# Patient Record
Sex: Female | Born: 1980 | Race: White | Hispanic: No | Marital: Single | Smoking: Never smoker
Health system: Southern US, Community
[De-identification: ages and names within clinical notes are randomized; demographics above are authoritative.]

---
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
## 2001-02-28 ENCOUNTER — Encounter: Admission: RE | Admit: 2001-02-28 | Discharge: 2001-05-29 | Payer: Self-pay | Admitting: *Deleted

## 2002-11-02 ENCOUNTER — Emergency Department (HOSPITAL_COMMUNITY): Admission: EM | Admit: 2002-11-02 | Discharge: 2002-11-02 | Payer: Self-pay | Admitting: Emergency Medicine

## 2005-02-07 ENCOUNTER — Emergency Department (HOSPITAL_COMMUNITY): Admission: EM | Admit: 2005-02-07 | Discharge: 2005-02-07 | Payer: Self-pay | Admitting: Emergency Medicine

## 2005-03-23 ENCOUNTER — Ambulatory Visit: Payer: Self-pay | Admitting: Gastroenterology

## 2005-04-27 ENCOUNTER — Ambulatory Visit: Payer: Self-pay | Admitting: Gastroenterology

## 2011-08-15 ENCOUNTER — Encounter (HOSPITAL_COMMUNITY): Payer: Self-pay | Admitting: Emergency Medicine

## 2011-08-15 ENCOUNTER — Emergency Department (HOSPITAL_COMMUNITY): Payer: No Typology Code available for payment source

## 2011-08-15 ENCOUNTER — Emergency Department (HOSPITAL_COMMUNITY)
Admission: EM | Admit: 2011-08-15 | Discharge: 2011-08-15 | Disposition: A | Discharge status: Home / Self Care | Payer: No Typology Code available for payment source | Attending: Emergency Medicine | Admitting: Emergency Medicine

## 2011-08-15 DIAGNOSIS — R51 Headache: Secondary | ICD-10-CM | POA: Insufficient documentation

## 2011-08-15 DIAGNOSIS — S139XXA Sprain of joints and ligaments of unspecified parts of neck, initial encounter: Secondary | ICD-10-CM | POA: Insufficient documentation

## 2011-08-15 DIAGNOSIS — M549 Dorsalgia, unspecified: Secondary | ICD-10-CM | POA: Insufficient documentation

## 2011-08-15 DIAGNOSIS — Y92410 Unspecified street and highway as the place of occurrence of the external cause: Secondary | ICD-10-CM | POA: Insufficient documentation

## 2011-08-15 DIAGNOSIS — S161XXA Strain of muscle, fascia and tendon at neck level, initial encounter: Secondary | ICD-10-CM

## 2011-08-15 MED ORDER — CYCLOBENZAPRINE HCL 10 MG PO TABS: 10.0000 mg | ORAL_TABLET | Freq: Two times a day (BID) | ORAL | Status: AC | PRN
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
## 2011-08-15 NOTE — ED Notes (Signed)
Patient transported to X-ray 
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
## 2011-08-15 NOTE — ED Notes (Signed)
Pt returned from xray to ED11.

## 2012-04-18 ENCOUNTER — Institutional Professional Consult (permissible substitution): Payer: Self-pay | Admitting: Pediatrics

## 2012-07-01 ENCOUNTER — Encounter (HOSPITAL_COMMUNITY)
Admission: RE | Admit: 2012-07-01 | Discharge: 2012-07-01 | Disposition: A | Discharge status: Home / Self Care | Payer: BC Managed Care – PPO | Source: Ambulatory Visit | Attending: Family Medicine | Admitting: Family Medicine

## 2012-07-01 DIAGNOSIS — O923 Agalactia: Secondary | ICD-10-CM | POA: Insufficient documentation
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
## 2020-05-19 ENCOUNTER — Other Ambulatory Visit: Payer: Self-pay | Admitting: Primary Care

## 2020-05-19 ENCOUNTER — Telehealth: Payer: Self-pay | Admitting: Adult Health

## 2020-05-19 ENCOUNTER — Telehealth (HOSPITAL_COMMUNITY): Payer: Self-pay

## 2020-05-19 NOTE — Telephone Encounter (Signed)
Called and LMOM regarding monoclonal antibody treatment for COVID 19 given to those who are at risk for complications and/or hospitalization of the virus. Unclear which criteria Houston Siren meets, asked that she call me back to discuss further.  Call back number given: [phone]  Lillard Anes, NP

## 2020-05-19 NOTE — Telephone Encounter (Signed)
Called to Discuss with patient about Covid symptoms and the use of the monoclonal antibody infusion for those with mild to moderate Covid symptoms and at a high risk of hospitalization.     Pt appears to potentially qualify for this infusion due to co-morbid conditions and/or a member of an at-risk group in accordance with the FDA Emergency Use Authorization.    Pre-screened patient and referred information to APP for further screening and potential scheduling for infusion.

## 2020-05-19 NOTE — Progress Notes (Signed)
I connected by phone with Houston Siren on 05/19/2020 at 2:45 PM to discuss the potential use of a new treatment for mild to moderate COVID-19 viral infection in non-hospitalized patients.  This patient is a 39 y.o. female that meets the FDA criteria for Emergency Use Authorization of COVID monoclonal antibody casirivimab/imdevimab or bamlanivimab/eteseviamb.  Has a (+) direct SARS-CoV-2 viral test result  Has mild or moderate COVID-19   Is NOT hospitalized due to COVID-19  Is within 10 days of symptom onset  Has at least one of the high risk factor(s) for progression to severe COVID-19 and/or hospitalization as defined in EUA.  Specific high risk criteria : BMI > 25   I have spoken and communicated the following to the patient or parent/caregiver regarding COVID monoclonal antibody treatment:  1. FDA has authorized the emergency use for the treatment of mild to moderate COVID-19 in adults and pediatric patients with positive results of direct SARS-CoV-2 viral testing who are 70 years of age and older weighing at least 40 kg, and who are at high risk for progressing to severe COVID-19 and/or hospitalization.  2. The significant known and potential risks and benefits of COVID monoclonal antibody, and the extent to which such potential risks and benefits are unknown.  3. Information on available alternative treatments and the risks and benefits of those alternatives, including clinical trials.  4. Patients treated with COVID monoclonal antibody should continue to self-isolate and use infection control measures (e.g., wear mask, isolate, social distance, avoid sharing personal items, clean and disinfect "high touch" surfaces, and frequent handwashing) according to CDC guidelines.   5. The patient or parent/caregiver has the option to accept or refuse COVID monoclonal antibody treatment.  After reviewing this information with the patient, the patient has agreed to receive one of the  available covid 19 monoclonal antibodies and will be provided an appropriate fact sheet prior to infusion. Doreene Nest, NP 05/19/2020 2:45 PM

## 2020-05-19 NOTE — Telephone Encounter (Signed)
Attempted to reach patient to discuss qualifications for MAB treatment, no answer. Left voicemail with hotline number to call back.

## 2020-05-20 ENCOUNTER — Ambulatory Visit (HOSPITAL_COMMUNITY)
Admission: RE | Admit: 2020-05-20 | Discharge: 2020-05-20 | Disposition: A | Discharge status: Home / Self Care | Payer: Managed Care, Other (non HMO) | Source: Ambulatory Visit | Attending: Pulmonary Disease | Admitting: Pulmonary Disease

## 2020-05-20 DIAGNOSIS — Z6825 Body mass index (BMI) 25.0-25.9, adult: Secondary | ICD-10-CM | POA: Insufficient documentation

## 2020-05-20 DIAGNOSIS — U071 COVID-19: Secondary | ICD-10-CM | POA: Insufficient documentation

## 2020-05-20 MED ORDER — EPINEPHRINE 0.3 MG/0.3ML IJ SOAJ: 0.3000 mg | Freq: Once | INTRAMUSCULAR | Status: DC | PRN

## 2020-05-20 MED ORDER — DIPHENHYDRAMINE HCL 50 MG/ML IJ SOLN: 50.0000 mg | Freq: Once | INTRAMUSCULAR | Status: DC | PRN

## 2020-05-20 MED ORDER — SODIUM CHLORIDE 0.9 % IV SOLN: Freq: Once | INTRAVENOUS | Status: AC

## 2020-05-20 MED ORDER — METHYLPREDNISOLONE SODIUM SUCC 125 MG IJ SOLR: 125.0000 mg | Freq: Once | INTRAMUSCULAR | Status: DC | PRN

## 2020-05-20 MED ORDER — SODIUM CHLORIDE 0.9 % IV SOLN: INTRAVENOUS | Status: DC | PRN

## 2020-05-20 MED ORDER — FAMOTIDINE IN NACL 20-0.9 MG/50ML-% IV SOLN: 20.0000 mg | Freq: Once | INTRAVENOUS | Status: DC | PRN

## 2020-05-20 MED ORDER — ALBUTEROL SULFATE HFA 108 (90 BASE) MCG/ACT IN AERS: 2.0000 | INHALATION_SPRAY | Freq: Once | RESPIRATORY_TRACT | Status: DC | PRN

## 2020-05-20 NOTE — Discharge Instructions (Signed)

## 2020-05-20 NOTE — Progress Notes (Signed)
  Diagnosis: COVID-19  Physician:  dr Delford Field  Procedure: [facility] Infusion [facility] Med: bamlanivimab\etesevimab infusion - Provided patient with bamlanimivab\etesevimab fact sheet for patients, parents and caregivers prior to infusion.  Complications: No immediate complications noted.  Discharge: Discharged home   Shaune Spittle 05/20/2020

## 2021-10-03 ENCOUNTER — Ambulatory Visit: Payer: Managed Care, Other (non HMO) | Attending: Physician Assistant | Admitting: Physical Therapy

## 2021-10-03 ENCOUNTER — Other Ambulatory Visit: Payer: Self-pay

## 2021-10-03 DIAGNOSIS — R279 Unspecified lack of coordination: Secondary | ICD-10-CM | POA: Insufficient documentation

## 2021-10-03 DIAGNOSIS — M6281 Muscle weakness (generalized): Secondary | ICD-10-CM | POA: Diagnosis present

## 2021-10-03 NOTE — Therapy (Signed)
?OUTPATIENT PHYSICAL THERAPY FEMALE PELVIC EVALUATION ? ? ?Patient Name: Patricia Jordan ?MRN: [id number] ?DOB:[birth year], 40 y.o., female ?Today's Date: 10/06/2021 ? ? PT End of Session - 10/06/21 1012   ? ? Visit Number 1   ? Date for PT Re-Evaluation 12/26/21   ? PT Start Time 1539   late  ? PT Stop Time 1614   ? PT Time Calculation (min) 35 min   ? Activity Tolerance Patient tolerated treatment well   ? Behavior During Therapy [facility] for tasks assessed/performed   ? ?  ?  ? ?  ? ? ?History reviewed. No pertinent past medical history. ?History reviewed. No pertinent surgical history. ?There are no problems to display for this patient. ? ? ?PCP: Ladora Daniel, PA-C ? ?REFERRING PROVIDER: Rick Duff, PA-C ? ?REFERRING DIAG: N39.41 (ICD-10-CM) - Urge incontinence ? ?THERAPY DIAG:  ?Muscle weakness (generalized) ? ?Unspecified lack of coordination ? ?ONSET DATE:  4 years ago ? ?SUBJECTIVE:                                                                                                                                                                                          ? ?SUBJECTIVE STATEMENT: ?It is happening when I am exercising even if I pee a lot ?Fluid intake:  water and one green tea - a lot of water ? ?Patient confirms identification and approves PT to assess pelvic floor and treatment. I will do next but kids are here ? ?PERTINENT HISTORY:  ?4 vaginal deliveries ?Sexual abuse: No ? ?PAIN:  ?Are you having pain? No ? ? ? ?BOWEL MOVEMENT ?Pain with bowel movement: No ? ? ?URINATION ?Pain with urination: No ?Fully empty bladder:  ?Stream: Strong ?Urgency: No ?Frequency: normal ? ?INTERCOURSE ? ? ?PREGNANCY ?Vaginal deliveries 4 ?Tearing No ? ? ?PROLAPSE ?None ? ?PRECAUTIONS: None ? ?WEIGHT BEARING RESTRICTIONS No ? ?FALLS:  ?Has patient fallen in last 6 months? No, Number of falls:  ? ?LIVING ENVIRONMENT: ?Lives with: lives with their family, lives with their spouse, and 4 boys ?Lives in:  House/apartment ? ? 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 ?PLOF: Independent ? ?PATIENT GOALS be able to exercise without worrying ? ? ?OBJECTIVE:  ? ? ? ?COGNITION: ? Overall cognitive status: Within functional limits for tasks assessed   ? ? ?MUSCLE LENGTH: ?WFL ? ?POSTURE:  ?Rounded shoulders, anterior pelvic tilt ? ?PALPATION: ?Internal Pelvic Floor: Deferred due to children present ? ?External Perineal Exam demonstrates contract and relax with palpation, can hold 3 seconds ? ?GENERAL holding breath when contracting the pelvic floor ? ?LUMBARAROM/PROM ? ?A/PROM A/PROM  ?10/06/2021  ?Flexion 80%  ?  Extension   ?Right lateral flexion   ?Left lateral flexion   ?Right rotation   ?Left rotation   ? (Blank rows = not tested) ? ?LE AROM/PROM: ? ?A/PROM Right ?10/06/2021 Left ?10/06/2021  ?Hip flexion 75% WFL  ?Hip extension    ?Hip abduction    ?Hip adduction    ?Hip internal rotation [facility] WFL  ?Hip external rotation [facility] WFL  ?Knee flexion    ?Knee extension    ?Ankle dorsiflexion    ?Ankle plantarflexion    ?Ankle inversion    ?Ankle eversion    ? (Blank rows = not tested) ? ?LE MMT: ?Grossly 5/5 bil WFL ? ?PELVIC MMT: ?  ?MMT  ?10/06/2021  ?Vaginal Deferred - see above external  ?Internal Anal Sphincter   ?External Anal Sphincter   ?Puborectalis   ?Diastasis Recti   ?(Blank rows = not tested) ? ? ?TONE: ?N/a ? ?PROLAPSE: ?N/a ? ?LUMBAR SPECIAL TESTS:  ?DRA - one finger, one knuckle ? ?FUNCTIONAL TESTS:  ?SLS ? ?GAIT: ? ?Comments: WFL ? ? ? ?TODAY'S TREATMENT  ?EVAL  ? ? ?PATIENT EDUCATION:  ?Education details: Access Code: [email or web address] ?Person educated: Patient ?Education method: Explanation and Handouts ?Education comprehension: verbalized understanding ? ? ?HOME EXERCISE PROGRAM: ?Access Code: [email or web address] ?URL: [email or web address] ?Date: 10/03/2021 ?Prepared by: Dwana Curd ? ?Exercises ?Supine Diaphragmatic Breathing - 3 x daily - 7 x weekly - 1 sets - 10 reps ?Standing Shoulder Flexion to 90  Degrees with Dumbbells - 3 x daily - 7 x weekly - 1 sets - 10 reps ? ? ?ASSESSMENT: ? ?CLINICAL IMPRESSION: ?Patient is a 41 y.o. female who was seen today for physical therapy evaluation and treatment for stress incontinence.  She has impairments mentioned above and decreased pelvic floor muscle endurance.  Pt has DRA of one finger and tends to hold her breath when tightening the pelvic floor muscles.  Pt will benefit from skilled PT to address all above mentioned impairments and return to all activities without leakage ?  ? ? ?OBJECTIVE IMPAIRMENTS decreased coordination, decreased endurance, and decreased strength.  ? ?ACTIVITY LIMITATIONS community activity and exercise .  ? ?PERSONAL FACTORS 1 comorbidity: 4 vaginal deliveries  are also affecting patient's functional outcome.  ? ? ?REHAB POTENTIAL: Excellent ? ?CLINICAL DECISION MAKING: Stable/uncomplicated ? ?EVALUATION COMPLEXITY: Low ? ? ?GOALS: ?Goals reviewed with patient? Yes ? ?SHORT TERM GOALS: ? ?Ind with initial HEP ?Baseline:  ?Target date: 10/30/21 ?Goal status: INITIAL ? ?LONG TERM GOALS: ? ?Pt will be independent with advanced HEP to maintain improvements made throughout therapy ?Baseline:  ?Target date: 12/26/21 ?Goal status: INITIAL ? ?2.  Pt will be able to run/workout for 45 minutes without leakage or discomfort  ?Baseline:  ?Target date: 12/26/21 ?Goal status: INITIAL ? ?3.  Pt will be able to run/workout for 45 minutes without using pads ?Baseline:  ?Target date: 12/26/21 ?Goal status: INITIAL ? ?4.  Pt will have 80% reduced leakage during a typical day  ?Baseline:  ?Target date: 12/26/21 ?Goal status: INITIAL ? ? ?PLAN: ?PT FREQUENCY: 1x/week ? ?PT DURATION: 12 weeks ? ?PLANNED INTERVENTIONS: Therapeutic exercises, Therapeutic activity, Neuromuscular re-education, Balance training, Gait training, Patient/Family education, Joint mobilization, Dry Needling, Electrical stimulation, Cryotherapy, Moist heat, Taping, Biofeedback, and Manual  therapy ? ?PLAN FOR NEXT SESSION: internal assessment if needed; review HEP kegel isolating in different positions and exhale with exertion ? ? ?Junious Silk, PT ?10/06/2021, 10:41 AM ? ?

## 2021-10-06 ENCOUNTER — Encounter: Payer: Self-pay | Admitting: Physical Therapy

## 2021-10-10 ENCOUNTER — Encounter: Payer: Managed Care, Other (non HMO) | Admitting: Physical Therapy

## 2021-10-17 ENCOUNTER — Encounter: Payer: Self-pay | Admitting: Physical Therapy

## 2021-10-17 ENCOUNTER — Other Ambulatory Visit: Payer: Self-pay

## 2021-10-17 ENCOUNTER — Ambulatory Visit: Payer: Managed Care, Other (non HMO) | Admitting: Physical Therapy

## 2021-10-17 DIAGNOSIS — R279 Unspecified lack of coordination: Secondary | ICD-10-CM

## 2021-10-17 DIAGNOSIS — M6281 Muscle weakness (generalized): Secondary | ICD-10-CM | POA: Diagnosis not present

## 2021-10-17 NOTE — Therapy (Signed)
?OUTPATIENT PHYSICAL THERAPY TREATMENT NOTE ? ? ?Patient Name: Patricia Jordan ?MRN: [id number] ?DOB:06-27-1981, 41 y.o., female ?Today's Date: 10/17/2021 ? ?PCP: Nicholes Rough, PA-C ?REFERRING PROVIDER: Nicholes Rough, PA-C ? ? PT End of Session - 10/17/21 1623   ? ? Visit Number 2   ? Date for PT Re-Evaluation 12/26/21   ? PT Start Time 1620   ? PT Stop Time 1700   ? PT Time Calculation (min) 40 min   ? Activity Tolerance Patient tolerated treatment well   ? Behavior During Therapy [facility] for tasks assessed/performed   ? ?  ?  ? ?  ? ? ?History reviewed. No pertinent past medical history. ?History reviewed. No pertinent surgical history. ?There are no problems to display for this patient. ? ? ?REFERRING DIAG: N39.41 (ICD-10-CM) - Urge incontinence ? ?THERAPY DIAG:  ?Muscle weakness (generalized) ? ?Unspecified lack of coordination ? ?PERTINENT HISTORY: 4 vaginal deliveries ? ?PRECAUTIONS: N/A ? ?SUBJECTIVE: I have been doing the exercises.  I worked out in one class without leaking but had to think about it a lot ? ?PAIN:  ?Are you having pain? No ? ? ? ?MUSCLE LENGTH: ?WFL ?  ?POSTURE:  ?Rounded shoulders, anterior pelvic tilt ?  ?PALPATION: ?Internal Pelvic Floor: Deferred due to children present ?  ?External Perineal Exam demonstrates contract and relax with palpation, can hold 3 seconds ?  ?GENERAL holding breath when contracting the pelvic floor ?  ?LUMBARAROM/PROM ?  ?A/PROM A/PROM  ?10/06/2021  ?Flexion 80%  ?Extension    ?Right lateral flexion    ?Left lateral flexion    ?Right rotation    ?Left rotation    ? (Blank rows = not tested) ?  ?LE AROM/PROM: ?  ?A/PROM Right ?10/06/2021 Left ?10/06/2021  ?Hip flexion 75% WFL  ?Hip extension      ?Hip abduction      ?Hip adduction      ?Hip internal rotation [facility] WFL  ?Hip external rotation [facility] WFL  ?Knee flexion      ?Knee extension      ?Ankle dorsiflexion      ?Ankle plantarflexion      ?Ankle inversion      ?Ankle eversion      ? (Blank rows = not tested) ?  ?LE MMT: ?Grossly  5/5 bil WFL ?  ?PELVIC MMT: ?  ?MMT   ?10/06/2021  ?Vaginal Deferred - see above external  ?Internal Anal Sphincter    ?External Anal Sphincter    ?Puborectalis    ?Diastasis Recti    ?(Blank rows = not tested) ?  ?  ?TONE: ?N/a ?  ?PROLAPSE: ?N/a ?  ?LUMBAR SPECIAL TESTS:  ?DRA - one finger, one knuckle ?  ?FUNCTIONAL TESTS:  ?SLS ?  ?GAIT: ?  ?Comments: WFL ?  ?  ?  ?TODAY'S TREATMENT  ?Supine: LE taps and SLR - 20 - cues for breathing and engaging the pelvic floor ?Standing: side lunge and forward lunge on BOSU ?Dead lift single leg modified ?Jumping educated on how to engage pelvic floor ? ?  ?  ?PATIENT EDUCATION:  ?Education details: Access Code: [email or web address] ?Person educated: Patient ?Education method: Explanation and Handouts ?Education comprehension: verbalized understanding ?  ?  ?HOME EXERCISE PROGRAM: ?[email or web address] Code: [email or web address] ?URL: [email or web address] ?Date: 10/17/2021 ?Prepared by: Jari Favre ? ?Exercises ?Supine Diaphragmatic Breathing - 3 x daily - 7 x weekly - 1 sets - 10 reps ?Standing Shoulder Flexion to 90 Degrees with Dumbbells - 3 x daily - 7 x weekly -  1 sets - 10 reps ?Supine Straight Leg Raise with Pelvic Floor Contraction - 2 x daily - 7 x weekly - 2 sets - 10 reps ?Modified Single-Leg Deadlift - 1 x daily - 7 x weekly - 3 sets - 10 reps ?Deadlift with Pelvic Contraction - 1 x daily - 7 x weekly - 3 sets - 10 reps ?Lateral Lunge - 1 x daily - 7 x weekly - 3 sets - 10 reps ?Squat Jumps - 1 x daily - 7 x weekly - 3 sets - 10 reps ? ?  ?  ?ASSESSMENT: ?  ?CLINICAL IMPRESSION: ?Patient has done well with initial HEP and able to do some exercise classes without leakage.  She still leaks with more jumping type of exercises.  Pt was able to progress strength and add to HEP. Pt needs cues and extra time to correctly coordinate breathing and engage the core.  She will benefit from skilled PT to continue to progress execises so she can return to full function without  leakage ?  ?  ?  ?OBJECTIVE IMPAIRMENTS decreased coordination, decreased endurance, and decreased strength.  ?  ?ACTIVITY LIMITATIONS community activity and exercise .  ?  ?PERSONAL FACTORS 1 comorbidity: 4 vaginal deliveries  are also affecting patient's functional outcome.  ?  ?  ?REHAB POTENTIAL: Excellent ?  ?CLINICAL DECISION MAKING: Stable/uncomplicated ?  ?EVALUATION COMPLEXITY: Low ?  ?  ?GOALS: ?Goals reviewed with patient? Yes ?  ?SHORT TERM GOALS: ?  ?Ind with initial HEP ?Baseline:  ?Target date: 10/30/21 ?Goal status: Met ?  ?LONG TERM GOALS: ?  ?Pt will be independent with advanced HEP to maintain improvements made throughout therapy ?Baseline:  ?Target date: 12/26/21 ?Goal status: INITIAL ?  ?2.  Pt will be able to run/workout for 45 minutes without leakage or discomfort  ?Baseline:  ?Target date: 12/26/21 ?Goal status: INITIAL ?  ?3.  Pt will be able to run/workout for 45 minutes without using pads ?Baseline:  ?Target date: 12/26/21 ?Goal status: INITIAL ?  ?4.  Pt will have 80% reduced leakage during a typical day  ?Baseline:  ?Target date: 12/26/21 ?Goal status: INITIAL ?  ?  ?PLAN: ?PT FREQUENCY: 1x/week ?  ?PT DURATION: 12 weeks ?  ?PLANNED INTERVENTIONS: Therapeutic exercises, Therapeutic activity, Neuromuscular re-education, Balance training, Gait training, Patient/Family education, Joint mobilization, Dry Needling, Electrical stimulation, Cryotherapy, Moist heat, Taping, Biofeedback, and Manual therapy ?  ?PLAN FOR NEXT SESSION: internal assessment if needed; review HEP kegel isolating in different positions and exhale with exertion ?  ?  ?Jule Ser, PT ?10/06/2021, 10:41 AM ?  ?  ?  ? ? ? ? ?Jule Ser, PT ?10/17/2021, 4:57 PM ? ?  ? ?

## 2021-11-08 NOTE — Therapy (Deleted)
?OUTPATIENT PHYSICAL THERAPY TREATMENT NOTE ? ? ?Patient Name: Patricia Jordan ?MRN: [id number] ?DOB:1981-06-22, 41 y.o., female ?Today's Date: 11/08/2021 ? ?PCP: Nicholes Rough, PA-C ?REFERRING PROVIDER: Doyle Askew, PA-C ? ? ? ? ?No past medical history on file. ?No past surgical history on file. ?There are no problems to display for this patient. ? ? ?REFERRING DIAG: N39.41 (ICD-10-CM) - Urge incontinence ? ?THERAPY DIAG:  ?No diagnosis found. ? ?PERTINENT HISTORY: 4 vaginal deliveries ? ?PRECAUTIONS: N/A ? ?SUBJECTIVE: I have been doing the exercises.  I worked out in one class without leaking but had to think about it a lot ? ?PAIN:  ?Are you having pain? No ? ? ? ?MUSCLE LENGTH: ?WFL ?  ?POSTURE:  ?Rounded shoulders, anterior pelvic tilt ?  ?PALPATION: ?Internal Pelvic Floor: Deferred due to children present ?  ?External Perineal Exam demonstrates contract and relax with palpation, can hold 3 seconds ?  ?GENERAL holding breath when contracting the pelvic floor ?  ?LUMBARAROM/PROM ?  ?A/PROM A/PROM  ?10/06/2021  ?Flexion 80%  ?Extension    ?Right lateral flexion    ?Left lateral flexion    ?Right rotation    ?Left rotation    ? (Blank rows = not tested) ?  ?LE AROM/PROM: ?  ?A/PROM Right ?10/06/2021 Left ?10/06/2021  ?Hip flexion 75% WFL  ?Hip extension      ?Hip abduction      ?Hip adduction      ?Hip internal rotation [facility] WFL  ?Hip external rotation [facility] WFL  ?Knee flexion      ?Knee extension      ?Ankle dorsiflexion      ?Ankle plantarflexion      ?Ankle inversion      ?Ankle eversion      ? (Blank rows = not tested) ?  ?LE MMT: ?Grossly 5/5 bil WFL ?  ?PELVIC MMT: ?  ?MMT   ?10/06/2021  ?Vaginal Deferred - see above external  ?Internal Anal Sphincter    ?External Anal Sphincter    ?Puborectalis    ?Diastasis Recti    ?(Blank rows = not tested) ?  ?  ?TONE: ?N/a ?  ?PROLAPSE: ?N/a ?  ?LUMBAR SPECIAL TESTS:  ?DRA - one finger, one knuckle ?  ?FUNCTIONAL TESTS:  ?SLS ?  ?GAIT: ?  ?Comments: WFL ?  ?  ?  ?TODAY'S  TREATMENT  ?Supine: LE taps and SLR - 20 - cues for breathing and engaging the pelvic floor ?Standing: side lunge and forward lunge on BOSU ?Dead lift single leg modified ?Jumping educated on how to engage pelvic floor ? ?  ?  ?PATIENT EDUCATION:  ?Education details: Access Code: [id number] ?Person educated: Patient ?Education method: Explanation and Handouts ?Education comprehension: verbalized understanding ?  ?  ?HOME EXERCISE PROGRAM: ?[email or web address] Code: [email or web address] ?URL: [email or web address] ?Date: 10/17/2021 ?Prepared by: Jari Favre ? ?Exercises ?Supine Diaphragmatic Breathing - 3 x daily - 7 x weekly - 1 sets - 10 reps ?Standing Shoulder Flexion to 90 Degrees with Dumbbells - 3 x daily - 7 x weekly - 1 sets - 10 reps ?Supine Straight Leg Raise with Pelvic Floor Contraction - 2 x daily - 7 x weekly - 2 sets - 10 reps ?Modified Single-Leg Deadlift - 1 x daily - 7 x weekly - 3 sets - 10 reps ?Deadlift with Pelvic Contraction - 1 x daily - 7 x weekly - 3 sets - 10 reps ?Lateral Lunge - 1 x daily - 7 x weekly - 3 sets - 10 reps ?Squat  Jumps - 1 x daily - 7 x weekly - 3 sets - 10 reps ? ?  ?  ?ASSESSMENT: ?  ?CLINICAL IMPRESSION: ?Patient has done well with initial HEP and able to do some exercise classes without leakage.  She still leaks with more jumping type of exercises.  Pt was able to progress strength and add to HEP. Pt needs cues and extra time to correctly coordinate breathing and engage the core.  She will benefit from skilled PT to continue to progress execises so she can return to full function without leakage ?  ?  ?  ?OBJECTIVE IMPAIRMENTS decreased coordination, decreased endurance, and decreased strength.  ?  ?ACTIVITY LIMITATIONS community activity and exercise .  ?  ?PERSONAL FACTORS 1 comorbidity: 4 vaginal deliveries  are also affecting patient's functional outcome.  ?  ?  ?REHAB POTENTIAL: Excellent ?  ?CLINICAL DECISION MAKING: Stable/uncomplicated ?  ?EVALUATION  COMPLEXITY: Low ?  ?  ?GOALS: ?Goals reviewed with patient? Yes ?  ?SHORT TERM GOALS: ?  ?Ind with initial HEP ?Baseline:  ?Target date: 10/30/21 ?Goal status: Met ?  ?LONG TERM GOALS: ?  ?Pt will be independent with advanced HEP to maintain improvements made throughout therapy ?Baseline:  ?Target date: 12/26/21 ?Goal status: INITIAL ?  ?2.  Pt will be able to run/workout for 45 minutes without leakage or discomfort  ?Baseline:  ?Target date: 12/26/21 ?Goal status: INITIAL ?  ?3.  Pt will be able to run/workout for 45 minutes without using pads ?Baseline:  ?Target date: 12/26/21 ?Goal status: INITIAL ?  ?4.  Pt will have 80% reduced leakage during a typical day  ?Baseline:  ?Target date: 12/26/21 ?Goal status: INITIAL ?  ?  ?PLAN: ?PT FREQUENCY: 1x/week ?  ?PT DURATION: 12 weeks ?  ?PLANNED INTERVENTIONS: Therapeutic exercises, Therapeutic activity, Neuromuscular re-education, Balance training, Gait training, Patient/Family education, Joint mobilization, Dry Needling, Electrical stimulation, Cryotherapy, Moist heat, Taping, Biofeedback, and Manual therapy ?  ?PLAN FOR NEXT SESSION: internal assessment if needed; review HEP kegel isolating in different positions and exhale with exertion ?  ?  ?Jule Ser, PT ?10/06/2021, 10:41 AM ?  ?  ?  ? ? ? ? ?Jule Ser, PT ?11/08/2021, 8:49 PM ? ?  ? ?

## 2021-11-09 ENCOUNTER — Ambulatory Visit: Payer: Managed Care, Other (non HMO) | Admitting: Physical Therapy

## 2021-11-21 ENCOUNTER — Ambulatory Visit: Payer: Managed Care, Other (non HMO) | Attending: Physician Assistant | Admitting: Physical Therapy

## 2021-11-21 ENCOUNTER — Encounter: Payer: Self-pay | Admitting: Physical Therapy

## 2021-11-21 DIAGNOSIS — M6281 Muscle weakness (generalized): Secondary | ICD-10-CM | POA: Insufficient documentation

## 2021-11-21 DIAGNOSIS — R279 Unspecified lack of coordination: Secondary | ICD-10-CM | POA: Diagnosis present

## 2021-11-21 NOTE — Therapy (Addendum)
OUTPATIENT PHYSICAL THERAPY TREATMENT NOTE   Patient Name: Patricia Jordan MRN: [id number] DOB:02-01-1981, 41 y.o., female Today's Date: 11/21/2021  PCP: Nicholes Rough, PA-C REFERRING PROVIDER: Barrie Lyme   PT End of Session - 11/21/21 1534     Visit Number 3    Date for PT Re-Evaluation 12/26/21    PT Start Time 1534    PT Stop Time 1615    PT Time Calculation (min) 41 min    Activity Tolerance Patient tolerated treatment well    Behavior During Therapy [facility] for tasks assessed/performed              History reviewed. No pertinent past medical history. History reviewed. No pertinent surgical history. There are no problems to display for this patient.   REFERRING DIAG: N39.41 (ICD-10-CM) - Urge incontinence  THERAPY DIAG:  Muscle weakness (generalized)  Unspecified lack of coordination  PERTINENT HISTORY: 4 vaginal deliveries  PRECAUTIONS: N/A  SUBJECTIVE: I am not leaking during workouts, I just have to really think about it.  Jumping still have to take breaks.  I ran for 15 min 2 times.  PAIN:  Are you having pain? No    MUSCLE LENGTH: WFL   POSTURE:  Rounded shoulders, anterior pelvic tilt   PALPATION: Internal Pelvic Floor: Deferred due to children present   External Perineal Exam demonstrates contract and relax with palpation, can hold 3 seconds   GENERAL holding breath when contracting the pelvic floor   LUMBARAROM/PROM   A/PROM A/PROM  10/06/2021  Flexion 80%  Extension    Right lateral flexion    Left lateral flexion    Right rotation    Left rotation     (Blank rows = not tested)   LE AROM/PROM:   A/PROM Right 10/06/2021 Left 10/06/2021  Hip flexion 75% WFL  Hip extension      Hip abduction      Hip adduction      Hip internal rotation [facility] WFL  Hip external rotation [facility]  Knee flexion      Knee extension      Ankle dorsiflexion      Ankle plantarflexion      Ankle inversion      Ankle eversion       (Blank  rows = not tested)   LE MMT: Grossly 5/5 bil WFL   PELVIC MMT:   MMT   10/06/2021  Vaginal Deferred - see above external  Internal Anal Sphincter    External Anal Sphincter    Puborectalis    Diastasis Recti    (Blank rows = not tested)     TONE: N/a   PROLAPSE: N/a   LUMBAR SPECIAL TESTS:  DRA - one finger, one knuckle   FUNCTIONAL TESTS:  SLS   GAIT:   Comments: WFL       TODAY'S TREATMENT  Treatment:11/21/21  Exercises  Plank with LE hip ext Pallof press green band - squat, single leg, staggered stance Side lunge hops Jumps squats Modified dead lift single leg  [employer] Education and cues for coordination of breathing and pelvic floor muscle contracting and relaxing at appropriate times      PATIENT EDUCATION:  Education details: Access Code: [email or web address] Person educated: Patient Education method: Explanation and Handouts Education comprehension: verbalized understanding     HOME EXERCISE PROGRAM: Access Code: [email or web address] URL: [email or web address] Date: 11/21/2021 Prepared by: Jari Favre  Exercises - Supine Diaphragmatic Breathing  - 3 x daily - 7 x weekly -  1 sets - 10 reps - Standing Shoulder Flexion to 90 Degrees with Dumbbells  - 3 x daily - 7 x weekly - 1 sets - 10 reps - Supine Straight Leg Raise with Pelvic Floor Contraction  - 2 x daily - 7 x weekly - 2 sets - 10 reps - Deadlift with Pelvic Contraction  - 1 x daily - 7 x weekly - 3 sets - 10 reps - Squat Jumps  - 1 x daily - 7 x weekly - 3 sets - 10 reps - Modified Single-Leg Deadlift  - 1 x daily - 7 x weekly - 3 sets - 10 reps - Lateral Lunge  - 1 x daily - 7 x weekly - 3 sets - 10 reps - Plank with Hip Extension  - 1 x daily - 7 x weekly - 3 sets - 10 reps - Squatting Anti-Rotation Press  - 1 x daily - 7 x weekly - 3 sets - 10 reps      ASSESSMENT:   CLINICAL IMPRESSION: Patient has done well with HEP.  She is not leaking or wearing a pad anymore.  Pt was  able to progress exercises. Pt is expected to continue to make progress and may only need 1-2 more visits if she continues to do exercises correctly at home without cueing.  Pt was able to progress strength and add to HEP.  She will benefit from skilled PT to continue to progress execises so she can return to full function and running without leakage       OBJECTIVE IMPAIRMENTS decreased coordination, decreased endurance, and decreased strength.    ACTIVITY LIMITATIONS community activity and exercise .    PERSONAL FACTORS 1 comorbidity: 4 vaginal deliveries  are also affecting patient's functional outcome.      REHAB POTENTIAL: Excellent   CLINICAL DECISION MAKING: Stable/uncomplicated   EVALUATION COMPLEXITY: Low     GOALS: Goals reviewed with patient? Yes   SHORT TERM GOALS:   Ind with initial HEP Baseline:  Target date: 10/30/21 Goal status: Met   LONG TERM GOALS:   Pt will be independent with advanced HEP to maintain improvements made throughout therapy Baseline:  Target date: 12/26/21 Goal status: ongoing   2.  Pt will be able to run/workout for 45 minutes without leakage or discomfort  Baseline: no discomfort, just a very small amount of leakage with 45 min workout and 15 min run Target date: 12/26/21 Goal status: partial met   3.  Pt will be able to run/workout for 45 minutes without using pads Baseline: not using now 11/21/21  Target date: 12/26/21 Goal status: Met   4.  Pt will have 80% reduced leakage during a typical day  Baseline: 60% less since starting 11/21/21  Target date: 12/26/21 Goal status: partial met     PLAN: PT FREQUENCY: 1x/week   PT DURATION: 12 weeks   PLANNED INTERVENTIONS: Therapeutic exercises, Therapeutic activity, Neuromuscular re-education, Balance training, Gait training, Patient/Family education, Joint mobilization, Dry Needling, Electrical stimulation, Cryotherapy, Moist heat, Taping, Biofeedback, and Manual therapy   PLAN FOR  NEXT SESSION: follow up and re-assess if not ready for d/c next     [employer], PT 10/06/2021, 10:41 AM        PHYSICAL THERAPY DISCHARGE SUMMARY  Visits from Start of Care: 3  Current functional level related to goals / functional outcomes:   See above goals Remaining deficits: See above   Education / Equipment: HEP   Patient agrees to discharge.  Patient goals were not met. Patient is being discharged due to the patient's request.  Pt has a lot going on at home and feels like she has enough to work on with the current HEP, so she will return later if needed  [employer], PT 11/21/2021, 4:57 PM

## 2021-12-05 ENCOUNTER — Encounter: Payer: Managed Care, Other (non HMO) | Admitting: Physical Therapy

## 2021-12-19 ENCOUNTER — Encounter: Payer: Managed Care, Other (non HMO) | Admitting: Physical Therapy
